# Patient Record
Sex: Male | Born: 2005 | Race: Black or African American | Hispanic: No | Marital: Single | Smoking: Never smoker
Health system: Southern US, Community
[De-identification: ages and names within clinical notes are randomized; demographics above are authoritative.]

## PROBLEM LIST (undated history)

## (undated) DIAGNOSIS — R479 Unspecified speech disturbances: Secondary | ICD-10-CM

## (undated) DIAGNOSIS — T7840XA Allergy, unspecified, initial encounter: Secondary | ICD-10-CM

## (undated) DIAGNOSIS — L309 Dermatitis, unspecified: Secondary | ICD-10-CM

## (undated) DIAGNOSIS — E119 Type 2 diabetes mellitus without complications: Secondary | ICD-10-CM

## (undated) DIAGNOSIS — F88 Other disorders of psychological development: Secondary | ICD-10-CM

## (undated) DIAGNOSIS — H9193 Unspecified hearing loss, bilateral: Secondary | ICD-10-CM

## (undated) DIAGNOSIS — R7303 Prediabetes: Secondary | ICD-10-CM

## (undated) DIAGNOSIS — J45909 Unspecified asthma, uncomplicated: Secondary | ICD-10-CM

## (undated) DIAGNOSIS — IMO0002 Reserved for concepts with insufficient information to code with codable children: Secondary | ICD-10-CM

## (undated) HISTORY — PX: GASTROSTOMY TUBE PLACEMENT: SHX655

## (undated) HISTORY — PX: CIRCUMCISION: SUR203

## (undated) HISTORY — DX: Type 2 diabetes mellitus without complications: E11.9

## (undated) HISTORY — DX: Other disorders of psychological development: F88

## (undated) HISTORY — DX: Reserved for concepts with insufficient information to code with codable children: IMO0002

## (undated) HISTORY — DX: Unspecified hearing loss, bilateral: H91.93

## (undated) HISTORY — PX: TONSILECTOMY, ADENOIDECTOMY, BILATERAL MYRINGOTOMY AND TUBES: SHX2538

## (undated) HISTORY — DX: Prediabetes: R73.03

---
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
## 2023-01-13 ENCOUNTER — Telehealth (INDEPENDENT_AMBULATORY_CARE_PROVIDER_SITE_OTHER): Payer: Self-pay

## 2023-01-13 NOTE — Telephone Encounter (Signed)
Received fax from pharmacy/covermymeds to complete prior authorization initiated on covermymeds, completed prior authorization   Pharmacy would like notification of determination [facility]  P: [phone] F: [id number]

## 2023-01-19 NOTE — Telephone Encounter (Signed)
Received fax requesting notes from the last 3 months and pt using dexcom as prescribed, unable to provide notes in the last 3 months pt has not been seen since 09/17/2022.  Called Patient to update. Mom verbalized understanding and confirmed she has equipment to do finger sticks until follow up appointment with Dr. Quincy Sheehan.

## 2023-02-04 NOTE — Progress Notes (Deleted)
Pediatric Endocrinology Diabetes Consultation Follow-up Visit Joshua Steele 05/26/2005 [id number] Georgann Housekeeper, MD  HPI: Joshua Steele  is a 17 y.o. male presenting for follow-up of Type 2 Diabetes. he is accompanied to this visit by his {family members:20773}.{Interpreter present throughout the visit:29436::"No"}.  Since last visit on 09/17/2022, he has been well.  There have been no ER visits or hospitalizations. At the last visit Libre 3 sample provided and Ozempic changed to [facility]. Annual labs ordered at the last visit were not done. Unable to complete PA for CGM due to no visit in past 3 months.   Insulin regimen: ***units/kg/day {Basal Insulin:29550} *** units at *** {Bolus Insulin:29545}: {Insulin Increments:29547} Time Carb Ratio ISF/CF Target (mg/dL)  Breakfast {Carb WUJWJ:19147} {ISF/CF:29543} {Daytime Target:29542}  Lunch {Carb Ratio:29544} {ISF/CF:29543} {Daytime Target:29542}  Snack {Carb Ratio:29544} {ISF/CF:29543} {Daytime Target:29542}  Dinner {Carb Ratio:29544} {ISF/CF:29543} {Daytime Target:29542}  Bedtime {Carb Ratio:29544} {ISF/CF:29543} {Night Target:29541::"200 mg/dL"}  Other diabetes medication(s): {Yes/No:29440} Hypoglycemia: {can/cannot:17900} feel most low blood sugars.  No glucagon needed recently.  Blood glucose download: {Glucose Meter:29156:::1} CGM download: {Continuous Glucose Monitor:29157}  Med-alert ID: {ACTION; IS/IS WGN:[id number]} currently wearing. Injection/Pump sites: {body part:18749} Health maintenance:  Diabetes Health Maintenance Due  Topic Date Due   FOOT EXAM  Never done   OPHTHALMOLOGY EXAM  11/21/2020   HEMOGLOBIN A1C  03/19/2023    ROS: Greater than 10 systems reviewed with pertinent positives listed in HPI, otherwise neg. The following portions of the patient's history were reviewed and updated as appropriate:  Past Medical History:  has a past medical history of Allergy, Asthma, Asthma, Chronic use of steroids, Diabetes (HCC),  Diabetes mellitus without complication (HCC), Eczema, Global developmental delay, Hearing loss of both ears, Prediabetes, Premature baby, and Speech abnormality.  Medications:  Outpatient Encounter Medications as of 02/05/2023  Medication Sig Note   Accu-Chek FastClix Lancets MISC CHECK BLOOD SUGAR 6 TIMES A DAY. (Patient not taking: Reported on 06/17/2020)    albuterol (PROVENTIL) (2.5 MG/3ML) 0.083% nebulizer solution Take 3 mLs (2.5 mg total) by nebulization every 6 (six) hours as needed for wheezing or shortness of breath. 12/27/2018: PRN   Blood Glucose Monitoring Suppl (ACCU-CHEK GUIDE) w/Device KIT 2 kits by Does not apply route daily as needed. Use to check glucose. (Patient not taking: Reported on 06/17/2020)    Continuous Blood Gluc Receiver (DEXCOM G6 RECEIVER) DEVI 1 Device by Does not apply route as directed. (Patient not taking: Reported on 10/30/2021)    Continuous Blood Gluc Transmit (DEXCOM G6 TRANSMITTER) MISC Inject 1 Device into the skin as directed. (re-use up to 8x with each new sensor) (Patient not taking: Reported on 09/17/2022)    Continuous Glucose Sensor (DEXCOM G6 SENSOR) MISC change sensor every 10 days    DiphenhydrAMINE HCl (BENADRYL ALLERGY PO) Take 1 tablet by mouth daily as needed (allergies). Reported on 06/13/2015    ELDERBERRY PO Take by mouth.    EPINEPHrine (ADRENALIN) 1 MG/ML injection Inject 1 mg into the muscle once as needed for anaphylaxis. Reported on 04/18/2015 10/19/2019: PRN   fluticasone (FLOVENT HFA) 110 MCG/ACT inhaler Inhale 2 puffs into the lungs 2 (two) times daily as needed (SOB).    glucose blood (ACCU-CHEK GUIDE) test strip USE TO check blood glucose SIX times daily (Patient not taking: Reported on 09/17/2022)    hydrocortisone 2.5 % cream Apply topically 2 (two) times daily.    insulin glargine (LANTUS SOLOSTAR) 100 UNIT/ML Solostar Pen Inject up to 50 units daily per provider instructions (Patient not taking: Reported on  05/19/2021)    loratadine  (CLARITIN) 10 MG tablet Take 10 mg by mouth daily as needed for allergies.     metFORMIN (GLUCOPHAGE) 500 MG tablet TAKE TWO TABLETS BY MOUTH TWICE DAILY    mometasone (NASONEX) 50 MCG/ACT nasal spray Place 2 sprays into the nose daily as needed (congestion). Reported on 06/13/2015    MOUNJARO 2.5 MG/0.5ML Pen Inject 2.5 mg into the skin once a week.    polymixin-bacitracin (POLYSPORIN) 500-10000 UNIT/GM OINT ointment Apply 1 application topically 2 (two) times daily. Apply to sore on the ankle until it is improved. 03/15/2018: PRN   TRUEPLUS 5-BEVEL PEN NEEDLES 31G X 5 MM MISC USE TO INJECT SIX timesd PER DAY    No facility-administered encounter medications on file as of 02/05/2023.   Allergies: Allergies  Allergen Reactions   Other Anaphylaxis and Other (See Comments)    "tree nuts"   Peanut-Containing Drug Products Itching, Rash, Shortness Of Breath and Swelling   Peanuts [Peanut Oil] Anaphylaxis and Swelling   Surgical History:  Past Surgical History:  Procedure Laterality Date   CIRCUMCISION     GASTROSTOMY TUBE PLACEMENT     TONSILECTOMY, ADENOIDECTOMY, BILATERAL MYRINGOTOMY AND TUBES     Family History: family history includes Asthma in his brother, maternal grandmother, and mother; Early death in his brother; Hypertension in his father, maternal grandfather, and mother; Obesity in his father and mother; Premature birth in his brother.  Social History: Social History   Social History Narrative          Pt lives at home with mother, sister, and maternal grandparents. Family has birds. 12h grader     Physical Exam:  There were no vitals filed for this visit. There were no vitals taken for this visit. Body mass index: body mass index is unknown because there is no height or weight on file. Blood pressure %iles are not available for patients who are 18 years or older. No height and weight on file for this encounter.   Ht Readings from Last 3 Encounters:  09/17/22 5' 7.56"  (1.716 m) (24%, Z= -0.71)*  05/19/21 5' 8.11" (1.73 m) (33%, Z= -0.44)*  02/12/21 5' 7.32" (1.71 m) (25%, Z= -0.69)*   * Growth percentiles are based on CDC (Boys, 2-20 Years) data.   Wt Readings from Last 3 Encounters:  09/17/22 227 lb (103 kg) (98%, Z= 2.03)*  10/30/21 220 lb 6.4 oz (100 kg) (97%, Z= 1.96)*  05/19/21 (!) 226 lb 6.4 oz (102.7 kg) (98%, Z= 2.11)*   * Growth percentiles are based on CDC (Boys, 2-20 Years) data.    Physical Exam   Labs: Lab Results  Component Value Date   ISLETAB Negative 03/25/2015  , No results found for: "INSULINAB",  Lab Results  Component Value Date   GLUTAMICACAB <5 05/01/2020  , No results found for: "ZNT8AB" No results found for: "LABIA2"  Lab Results  Component Value Date   CPEPTIDE 4.43 (H) 05/01/2020   Last hemoglobin A1c:  Lab Results  Component Value Date   HGBA1C 9.4 09/17/2022   Results for orders placed or performed in visit on 09/17/22  POCT Glucose (Device for Home Use)  Result Value Ref Range   Glucose Fasting, POC     POC Glucose 224 (A) 70 - 99 mg/dl  POCT glycosylated hemoglobin (Hb A1C)  Result Value Ref Range   Hemoglobin A1C     HbA1c POC (<> result, manual entry) 9.4 4.0 - 5.6 %   HbA1c, POC (  prediabetic range)     HbA1c, POC (controlled diabetic range)     Lab Results  Component Value Date   HGBA1C 9.4 09/17/2022   HGBA1C 9.2 (A) 10/30/2021   HGBA1C 8.2 05/19/2021   Lab Results  Component Value Date   MICROALBUR 0.4 08/13/2016   LDLCALC 75 05/19/2021   CREATININE 1.01 05/19/2021   Lab Results  Component Value Date   TSH 1.65 05/19/2021   FREE T4 1.0 05/19/2021    Assessment/Plan: Uncontrolled type 2 diabetes mellitus with hyperglycemia (HCC)    There are no Patient Instructions on file for this visit.  Follow-up:   No follow-ups on file.   Medical decision-making:  I have personally spent *** minutes involved in face-to-face and non-face-to-face activities for this patient on the day of  the visit. Professional time spent includes the following activities, in addition to those noted in the documentation: preparation time/chart review, ordering of medications/tests/procedures, obtaining and/or reviewing separately obtained history, counseling and educating the patient/family/caregiver, performing a medically appropriate examination and/or evaluation, referring and communicating with other health care professionals for care coordination, *** review and interpretation of glucose logs/continuous glucose monitor logs, *** interpretation of pump downloads, ***creating/updating school orders, and documentation in the EHR.  Thank you for the opportunity to participate in the care of our mutual patient. Please do not hesitate to contact me should you have any questions regarding the assessment or treatment plan.   Sincerely,   Silvana Newness, MD

## 2023-02-05 ENCOUNTER — Ambulatory Visit (INDEPENDENT_AMBULATORY_CARE_PROVIDER_SITE_OTHER): Payer: Self-pay | Admitting: Pediatrics

## 2023-02-05 DIAGNOSIS — E1165 Type 2 diabetes mellitus with hyperglycemia: Secondary | ICD-10-CM

## 2023-03-11 ENCOUNTER — Other Ambulatory Visit (INDEPENDENT_AMBULATORY_CARE_PROVIDER_SITE_OTHER): Payer: Self-pay | Admitting: Pediatric Endocrinology

## 2023-03-11 ENCOUNTER — Other Ambulatory Visit (INDEPENDENT_AMBULATORY_CARE_PROVIDER_SITE_OTHER): Payer: Self-pay | Admitting: Pediatrics

## 2023-03-11 DIAGNOSIS — R7303 Prediabetes: Secondary | ICD-10-CM

## 2023-03-11 DIAGNOSIS — E1165 Type 2 diabetes mellitus with hyperglycemia: Secondary | ICD-10-CM

## 2023-03-12 MED ORDER — METFORMIN HCL 500 MG PO TABS: ORAL_TABLET | ORAL | 3 refills | Status: DC

## 2023-03-12 NOTE — Addendum Note (Signed)
Addended by: Morene Antu on: 03/12/2023 11:00 AM   Modules accepted: Orders

## 2023-04-05 ENCOUNTER — Other Ambulatory Visit (INDEPENDENT_AMBULATORY_CARE_PROVIDER_SITE_OTHER): Payer: Self-pay | Admitting: Pediatric Endocrinology

## 2023-05-19 DIAGNOSIS — Z7187 Encounter for pediatric-to-adult transition counseling: Secondary | ICD-10-CM | POA: Insufficient documentation

## 2023-05-19 NOTE — Progress Notes (Unsigned)
Pediatric Endocrinology Diabetes Consultation Follow-up Visit Joshua Steele 10/31/2005 [id number] Georgann Housekeeper, MD  HPI: Joshua Steele  is a 18 y.o. male presenting for follow-up of {DIABETES TYPE PLUS:20287}. he is accompanied to this visit by his {family members:20773}.{Interpreter present throughout the visit:29436::"No"}.  Since last visit on 09/17/2022, he has been well.  There have been no ER visits or hospitalizations.  Insulin regimen: ***units/kg/day {Basal Insulin:29550} *** units at *** {Bolus Insulin:29545}: {Insulin Increments:29547} Time Carb Ratio ISF/CF Target (mg/dL)  Breakfast {Carb WUJWJ:19147} {ISF/CF:29543} {Daytime Target:29542}  Lunch {Carb Ratio:29544} {ISF/CF:29543} {Daytime Target:29542}  Snack {Carb Ratio:29544} {ISF/CF:29543} {Daytime Target:29542}  Dinner {Carb Ratio:29544} {ISF/CF:29543} {Daytime Target:29542}  Bedtime {Carb Ratio:29544} {ISF/CF:29543} {Night Target:29541::"200 mg/dL"}  Other diabetes medication(s): {Yes/No:29440} Hypoglycemia: {can/cannot:17900} feel most low blood sugars.  No glucagon needed recently.  CGM download: {Continuous Glucose Monitor:29157}  Med-alert ID: {ACTION; IS/IS WGN:[id number]} currently wearing. Injection/Pump sites: {body part:18749} Health maintenance:  Diabetes Health Maintenance Due  Topic Date Due   FOOT EXAM  Never done   OPHTHALMOLOGY EXAM  11/21/2020   HEMOGLOBIN A1C  03/19/2023    ROS: Greater than 10 systems reviewed with pertinent positives listed in HPI, otherwise neg. The following portions of the patient's history were reviewed and updated as appropriate:  Past Medical History:  has a past medical history of Allergy, Asthma, Asthma, Chronic use of steroids, Diabetes (HCC), Diabetes mellitus without complication (HCC), Eczema, Global developmental delay, Hearing loss of both ears, Prediabetes, Premature baby, and Speech abnormality.  Medications:  Outpatient Encounter Medications as of 05/20/2023   Medication Sig Note   Accu-Chek FastClix Lancets MISC CHECK BLOOD SUGAR 6 TIMES A DAY. (Patient not taking: Reported on 06/17/2020)    albuterol (PROVENTIL) (2.5 MG/3ML) 0.083% nebulizer solution Take 3 mLs (2.5 mg total) by nebulization every 6 (six) hours as needed for wheezing or shortness of breath. 12/27/2018: PRN   Blood Glucose Monitoring Suppl (ACCU-CHEK GUIDE) w/Device KIT 2 kits by Does not apply route daily as needed. Use to check glucose. (Patient not taking: Reported on 06/17/2020)    Continuous Blood Gluc Receiver (DEXCOM G6 RECEIVER) DEVI 1 Device by Does not apply route as directed. (Patient not taking: Reported on 10/30/2021)    Continuous Blood Gluc Transmit (DEXCOM G6 TRANSMITTER) MISC Inject 1 Device into the skin as directed. (re-use up to 8x with each new sensor) (Patient not taking: Reported on 09/17/2022)    Continuous Glucose Sensor (DEXCOM G6 SENSOR) MISC change sensor every 10 days    DiphenhydrAMINE HCl (BENADRYL ALLERGY PO) Take 1 tablet by mouth daily as needed (allergies). Reported on 06/13/2015    ELDERBERRY PO Take by mouth.    EPINEPHrine (ADRENALIN) 1 MG/ML injection Inject 1 mg into the muscle once as needed for anaphylaxis. Reported on 04/18/2015 10/19/2019: PRN   fluticasone (FLOVENT HFA) 110 MCG/ACT inhaler Inhale 2 puffs into the lungs 2 (two) times daily as needed (SOB).    glucose blood (ACCU-CHEK GUIDE) test strip USE TO check blood glucose SIX times daily (Patient not taking: Reported on 09/17/2022)    hydrocortisone 2.5 % cream Apply topically 2 (two) times daily.    insulin glargine (LANTUS SOLOSTAR) 100 UNIT/ML Solostar Pen Inject up to 50 units daily per provider instructions (Patient not taking: Reported on 05/19/2021)    loratadine (CLARITIN) 10 MG tablet Take 10 mg by mouth daily as needed for allergies.     metFORMIN (GLUCOPHAGE) 500 MG tablet TAKE TWO TABLETS BY MOUTH TWICE DAILY.  Needs to keep appt for further refills.  mometasone (NASONEX) 50 MCG/ACT  nasal spray Place 2 sprays into the nose daily as needed (congestion). Reported on 06/13/2015    polymixin-bacitracin (POLYSPORIN) 500-10000 UNIT/GM OINT ointment Apply 1 application topically 2 (two) times daily. Apply to sore on the ankle until it is improved. 03/15/2018: PRN   tirzepatide (MOUNJARO) 2.5 MG/0.5ML Pen Inject 2.5 mg into the skin once a week.    TRUEPLUS 5-BEVEL PEN NEEDLES 31G X 5 MM MISC USE TO INJECT SIX timesd PER DAY    No facility-administered encounter medications on file as of 05/20/2023.   Allergies: Allergies  Allergen Reactions   Other Anaphylaxis and Other (See Comments)    "tree nuts"   Peanut-Containing Drug Products Itching, Rash, Shortness Of Breath and Swelling   Peanuts [Peanut Oil] Anaphylaxis and Swelling   Surgical History:  Past Surgical History:  Procedure Laterality Date   CIRCUMCISION     GASTROSTOMY TUBE PLACEMENT     TONSILECTOMY, ADENOIDECTOMY, BILATERAL MYRINGOTOMY AND TUBES     Family History: family history includes Asthma in his brother, maternal grandmother, and mother; Early death in his brother; Hypertension in his father, maternal grandfather, and mother; Obesity in his father and mother; Premature birth in his brother.  Social History: Social History   Social History Narrative          Pt lives at home with mother, sister, and maternal grandparents. Family has birds. 12h grader     Physical Exam:  There were no vitals filed for this visit. There were no vitals taken for this visit. Body mass index: body mass index is unknown because there is no height or weight on file. Blood pressure %iles are not available for patients who are 18 years or older. No height and weight on file for this encounter.   Ht Readings from Last 3 Encounters:  09/17/22 5' 7.56" (1.716 m) (24%, Z= -0.71)*  05/19/21 5' 8.11" (1.73 m) (33%, Z= -0.44)*  02/12/21 5' 7.32" (1.71 m) (25%, Z= -0.69)*   * Growth percentiles are based on CDC (Boys, 2-20  Years) data.   Wt Readings from Last 3 Encounters:  09/17/22 227 lb (103 kg) (98%, Z= 2.03)*  10/30/21 220 lb 6.4 oz (100 kg) (97%, Z= 1.96)*  05/19/21 (!) 226 lb 6.4 oz (102.7 kg) (98%, Z= 2.11)*   * Growth percentiles are based on CDC (Boys, 2-20 Years) data.    Physical Exam   Labs: Lab Results  Component Value Date   ISLETAB Negative 03/25/2015  , No results found for: "INSULINAB",  Lab Results  Component Value Date   GLUTAMICACAB <5 05/01/2020  , No results found for: "ZNT8AB" No results found for: "LABIA2"  Lab Results  Component Value Date   CPEPTIDE 4.43 (H) 05/01/2020   Last hemoglobin A1c:  Lab Results  Component Value Date   HGBA1C 9.4 09/17/2022   Results for orders placed or performed in visit on 09/17/22  POCT Glucose (Device for Home Use)   Collection Time: 09/17/22  9:18 AM  Result Value Ref Range   Glucose Fasting, POC     POC Glucose 224 (A) 70 - 99 mg/dl  POCT glycosylated hemoglobin (Hb A1C)   Collection Time: 09/17/22  9:27 AM  Result Value Ref Range   Hemoglobin A1C     HbA1c POC (<> result, manual entry) 9.4 4.0 - 5.6 %   HbA1c, POC (prediabetic range)     HbA1c, POC (controlled diabetic range)     Lab Results  Component Value Date  HGBA1C 9.4 09/17/2022   HGBA1C 9.2 (A) 10/30/2021   HGBA1C 8.2 05/19/2021   Lab Results  Component Value Date   MICROALBUR 0.4 08/13/2016   LDLCALC 75 05/19/2021   CREATININE 1.01 05/19/2021   Lab Results  Component Value Date   TSH 1.65 05/19/2021   FREE T4 1.0 05/19/2021    Assessment/Plan: Uncontrolled type 2 diabetes mellitus with hyperglycemia (HCC) Overview: Almond was admitted to [facility] in February 2012 due to respiratory distress. During that admission he was noted to have hyperglycemia. His A1C was 6.4%. [name] was consulted and started [name] 250 mg BID. He had been taking it all spring. He stopped taking it over the summer but restarted in September 2012 after a visit to Dr.  [name]. He was lost to follow up until he presented to Dr. [name] in December 2016 with a random glucose of 417. He was admitted and ultimately treated with metformin and glipizide. Mom found it very hard to try and learn to manage insulin.      There are no Patient Instructions on file for this visit.  Follow-up:   No follow-ups on file.   Medical decision-making:  I have personally spent *** minutes involved in face-to-face and non-face-to-face activities for this patient on the day of the visit. Professional time spent includes the following activities, in addition to those noted in the documentation: preparation time/chart review, ordering of medications/tests/procedures, obtaining and/or reviewing separately obtained history, counseling and educating the patient/family/caregiver, performing a medically appropriate examination and/or evaluation, referring and communicating with other health care professionals for care coordination, *** review and interpretation of glucose logs/continuous glucose monitor logs, *** interpretation of pump downloads, ***creating/updating school orders, and documentation in the EHR. This time does not include the time spent for CGM interpretation.   Thank you for the opportunity to participate in the care of our mutual patient. Please do not hesitate to contact me should you have any questions regarding the assessment or treatment plan.   Sincerely,   Silvana Newness, MD

## 2023-05-20 ENCOUNTER — Ambulatory Visit (INDEPENDENT_AMBULATORY_CARE_PROVIDER_SITE_OTHER): Payer: Self-pay | Admitting: Pediatrics

## 2023-05-20 ENCOUNTER — Encounter (INDEPENDENT_AMBULATORY_CARE_PROVIDER_SITE_OTHER): Payer: Self-pay | Admitting: Pediatrics

## 2023-05-20 VITALS — BP 118/80 | HR 84 | Wt 228.4 lb

## 2023-05-20 DIAGNOSIS — E1165 Type 2 diabetes mellitus with hyperglycemia: Secondary | ICD-10-CM

## 2023-05-20 DIAGNOSIS — E66812 Obesity, class 2: Secondary | ICD-10-CM

## 2023-05-20 DIAGNOSIS — Z7187 Encounter for pediatric-to-adult transition counseling: Secondary | ICD-10-CM

## 2023-05-20 DIAGNOSIS — Z6835 Body mass index (BMI) 35.0-35.9, adult: Secondary | ICD-10-CM | POA: Diagnosis not present

## 2023-05-20 DIAGNOSIS — Z91199 Patient's noncompliance with other medical treatment and regimen due to unspecified reason: Secondary | ICD-10-CM

## 2023-05-20 DIAGNOSIS — F840 Autistic disorder: Secondary | ICD-10-CM

## 2023-05-20 DIAGNOSIS — Z7984 Long term (current) use of oral hypoglycemic drugs: Secondary | ICD-10-CM

## 2023-05-20 LAB — POCT GLYCOSYLATED HEMOGLOBIN (HGB A1C): Hemoglobin A1C: 9.3 % — AB (ref 4.0–5.6)

## 2023-05-20 LAB — POCT GLUCOSE (DEVICE FOR HOME USE): POC Glucose: 288 mg/dL — AB (ref 70–99)

## 2023-05-20 MED ORDER — DAPAGLIFLOZIN PROPANEDIOL 5 MG PO TABS: 5.0000 mg | ORAL_TABLET | Freq: Every day | ORAL | 1 refills | Status: AC

## 2023-05-20 MED ORDER — ACCU-CHEK GUIDE W/DEVICE KIT: PACK | 1 refills | Status: AC

## 2023-05-20 MED ORDER — ACCU-CHEK GUIDE TEST VI STRP: ORAL_STRIP | 5 refills | Status: AC

## 2023-05-20 MED ORDER — METFORMIN HCL ER 500 MG PO TB24: 1000.0000 mg | ORAL_TABLET | Freq: Two times a day (BID) | ORAL | 1 refills | Status: AC

## 2023-05-20 MED ORDER — ACCU-CHEK SOFTCLIX LANCETS MISC: 5 refills | Status: AC

## 2023-05-20 NOTE — Assessment & Plan Note (Signed)
Diabetes mellitus Type II, under poor control. The HbA1c is above goal of 7% or lower. A1c has been relatively unchanged over a year. Honoring that he is only willing to take pills, will continue metformin and start SGLT-2. Risks and benefits reviewed. Discussed symptoms due to hyperglycemia and usually would start insulin with this elevated A1c. He is following up with adult endo next month, so provided refills until then and defer to their management. He is overdue for annual screening and defer to adult management as well since we have ordered, and it has not been done.  Medications: continued metformin; See medication orders below, discontinued GLP-1; See medication orders below, and started SGLT2; See medication orders below, School Orders/DMMP: Not needed, Laboratory Studies: POCT HbA1c at next visit, Education: Discussed foot care, Dietary counseling provided focusing on ADA diet, meeting cholesterol goals, and healthy relationship with food, Encouraged aerobic exercise, Discussed diabetes mellitus pathophysiology and management, and Discussed that adult endo will need to order annual screening studies, Provided Printed Education Material/has MyChart Access, Vaccine Counseling Provided for ADA Recommended Vaccinations, and Due for optho soon

## 2023-05-20 NOTE — Patient Instructions (Addendum)
HbA1c Goals: Our ultimate goal is to achieve the lowest possible HbA1c while avoiding recurrent severe hypoglycemia.  However, all HbA1c goals must be individualized per the American Diabetes Association Clinical Standards. My Hemoglobin A1c History:  Lab Results  Component Value Date   HGBA1C 9.3 (A) 05/20/2023   HGBA1C 9.4 09/17/2022   HGBA1C 9.2 (A) 10/30/2021   HGBA1C 8.2 05/19/2021   HGBA1C 11.4 (A) 02/12/2021   HGBA1C 8.4 (A) 11/06/2020   HGBA1C 8.2 (A) 07/30/2020   HGBA1C 11.5 (A) 05/01/2020   HGBA1C 7.1 (A) 01/24/2020   HGBA1C 9.8 (H) 04/19/2019   HGBA1C 16.3 (H) 03/25/2015   HGBA1C (H) 05/20/2010    6.4 (NOTE)                                                                       According to the ADA Clinical Practice Recommendations for 2011, when HbA1c is used as a screening test:   >=6.5%   Diagnostic of Diabetes Mellitus           (if abnormal result  is confirmed)  5.7-6.4%   Increased risk of developing Diabetes Mellitus  References:Diagnosis and Classification of Diabetes Mellitus,Diabetes Care,2011,34(Suppl 1):S62-S69 and Standards of Medical Care in         Diabetes - 2011,Diabetes Care,2011,34  (Suppl 1):S11-S61.   My goal HbA1c is: < 7 %  This is equivalent to an average blood glucose of:  HbA1c % = Average BG  5  97 (78-120)__ 6  126 (100-152)  7  154 (123-185) 8  183 (147-217)  9  212 (170-249)  10  240 (193-282)  11  269 (217-314)  12  298 (240-347)  13  330    Time in Range (TIR) Goals: Target Range over 70% of the time and Very Low less than 4% of the time.  Farxiga Sick Day Plan You are at risk of developing ketones while your blood sugars are at normal range 70-180 mg/dL on this medication, especially when sick. The plan when sick is to: - STOP [place] - Drink water frequently (1 cup of water every hour while awake) - PLEASE contact provider at [phone] for an insulin adjustment    Adult endo follow up 06/28/23 at 1PM  Medications:  Please  allow 3 days for prescription refill requests! After hours are for emergencies only.  Check Blood Glucose: Remember to bring to next endo appt do download. They can use this to get CGM PA processed. Before breakfast, before lunch, before dinner, at bedtime, and for symptoms of high or low blood glucose as a minimum.  Check BG 2 hours after meals if adjusting doses.   Check more frequently on days with more activity than normal.   Check in the middle of the night when evening insulin doses are changed, on days with extra activity in the evening, and if you suspect overnight low glucoses are occurring.   Send a MyChart message as needed for patterns of high or low glucose levels, or multiple low glucoses. As a general rule, ALWAYS call us to review your child's blood glucoses IF: Your child has a seizure You have to use glucagon/Baqsimi/Gvoke or glucose gel to bring up the blood sugar  IF you notice  a pattern of high blood sugars  If in a week, your child has: 1 blood glucose that is 40 or less  2 blood glucoses that are 50 or less at the same time of day 3 blood glucoses that are 60 or less at the same time of day  Phone: [phone] Ketones: Check urine or blood ketones, and if blood glucose is greater than 300 mg/dL (injections) or 098 mg/dL (pump), when ill, or if having symptoms of ketones.  Call if Urine Ketones are moderate or large Call if Blood Ketones are moderate (1-1.5) or large (more than1.5) Exercise Plan:  Any activity that makes you sweat most days for 60 minutes.  Safety Wear Medical Alert at [facility] Citizens requesting the Yellow Dot Packages should contact Airline pilot at the [facility] by calling [phone] or e-mail [email or web address]. TEEN REMINDERS:  Check blood glucose before driving If sexually active, use reliable birth control including condoms.  Alcohol in moderation only - check glucoses more frequently, & have a  snack with no carb coverage. Glucose gel/cake icing for low glucose. Check glucoses in the middle of the night. Education:Please refer to your diabetes education book. A copy can be found here: [email or web address] Other: Schedule an eye exam yearly and a dental exam.  Recommend dental cleaning every 6 months. Get a flu vaccine yearly, and Covid-19 vaccine yearly unless contraindicated. Rotate injections sites and avoid any hard lumps (lipohypertrophy)

## 2023-05-28 ENCOUNTER — Other Ambulatory Visit (INDEPENDENT_AMBULATORY_CARE_PROVIDER_SITE_OTHER): Payer: Self-pay | Admitting: Pediatrics

## 2023-06-28 ENCOUNTER — Ambulatory Visit: Payer: Medicaid Other | Admitting: Nurse Practitioner

## 2023-06-28 DIAGNOSIS — E1165 Type 2 diabetes mellitus with hyperglycemia: Secondary | ICD-10-CM

## 2023-06-28 DIAGNOSIS — Z7984 Long term (current) use of oral hypoglycemic drugs: Secondary | ICD-10-CM

## 2023-06-28 DIAGNOSIS — Z91199 Patient's noncompliance with other medical treatment and regimen due to unspecified reason: Secondary | ICD-10-CM

## 2023-06-28 NOTE — Progress Notes (Deleted)
 Endocrinology Consult Note       06/28/2023, 7:34 AM   Subjective:    Patient ID: Joshua Steele, male    DOB: Jan 09, 2006.  Joshua Steele is being seen in consultation for management of currently uncontrolled symptomatic diabetes requested by  Georgann Housekeeper, MD.  He was previously followed by Peds Endo.   Past Medical History:  Diagnosis Date   Allergy    P-nuts & tree nuts   Asthma    Asthma    Chronic use of steroids    Diabetes (HCC)    Diabetes mellitus without complication (HCC)    Phreesia 01/31/2020   Eczema    Global developmental delay    Hearing loss of both ears    Prediabetes    Premature baby    ex- 24 weeker   Speech abnormality     Past Surgical History:  Procedure Laterality Date   CIRCUMCISION     GASTROSTOMY TUBE PLACEMENT     TONSILECTOMY, ADENOIDECTOMY, BILATERAL MYRINGOTOMY AND TUBES      Social History   Socioeconomic History   Marital status: Single    Spouse name: Not on file   Number of children: Not on file   Years of education: Not on file   Highest education level: Not on file  Occupational History   Not on file  Tobacco Use   Smoking status: Never    Passive exposure: Yes   Smokeless tobacco: Never   Tobacco comments:    smokes outside  Substance and Sexual Activity   Alcohol use: No   Drug use: No   Sexual activity: Never  Other Topics Concern   Not on file  Social History Narrative       Pt lives at home with mother, sister, and maternal grandparents. Family has birds. 12h grader   Social Drivers of Corporate investment banker Strain: Not on file  Food Insecurity: Not on file  Transportation Needs: Not on file  Physical Activity: Not on file  Stress: Not on file  Social Connections: Not on file    Family History  Problem Relation Age of Onset   Hypertension Mother    Obesity Mother    Asthma Mother    Obesity Father     Hypertension Father    Hypertension Maternal Grandfather    Asthma Brother    Early death Brother        asphyxiation   Premature birth Brother    Asthma Maternal Grandmother    Diabetes Neg Hx    Thyroid disease Neg Hx     Outpatient Encounter Medications as of 06/28/2023  Medication Sig   Accu-Chek Softclix Lancets lancets Use as directed to check glucose 6x/day.   albuterol (PROVENTIL) (2.5 MG/3ML) 0.083% nebulizer solution Take 3 mLs (2.5 mg total) by nebulization every 6 (six) hours as needed for wheezing or shortness of breath. (Patient not taking: Reported on 05/20/2023)   Blood Glucose Monitoring Suppl (ACCU-CHEK GUIDE) w/Device KIT Use as directed to check glucose.   Continuous Blood Gluc Receiver (DEXCOM G6 RECEIVER) DEVI 1 Device by Does not apply route as directed. (Patient not taking: Reported on 05/20/2023)   Continuous Blood Gluc  Transmit (DEXCOM G6 TRANSMITTER) MISC Inject 1 Device into the skin as directed. (re-use up to 8x with each new sensor) (Patient not taking: Reported on 05/20/2023)   Continuous Glucose Sensor (DEXCOM G6 SENSOR) MISC change sensor every 10 days (Patient not taking: Reported on 05/20/2023)   dapagliflozin propanediol (FARXIGA) 5 MG TABS tablet Take 1 tablet (5 mg total) by mouth daily.   DiphenhydrAMINE HCl (BENADRYL ALLERGY PO) Take 1 tablet by mouth daily as needed (allergies). Reported on 06/13/2015 (Patient not taking: Reported on 05/20/2023)   ELDERBERRY PO Take by mouth. (Patient not taking: Reported on 05/20/2023)   EPINEPHrine (ADRENALIN) 1 MG/ML injection Inject 1 mg into the muscle once as needed for anaphylaxis. Reported on 04/18/2015 (Patient not taking: Reported on 05/20/2023)   fluticasone (FLOVENT HFA) 110 MCG/ACT inhaler Inhale 2 puffs into the lungs 2 (two) times daily as needed (SOB). (Patient not taking: Reported on 05/20/2023)   glucose blood (ACCU-CHEK GUIDE TEST) test strip Use as instructed 6x/day   hydrocortisone 2.5 % cream Apply topically  2 (two) times daily. (Patient not taking: Reported on 05/20/2023)   loratadine (CLARITIN) 10 MG tablet Take 10 mg by mouth daily as needed for allergies.    metFORMIN (GLUCOPHAGE-XR) 500 MG 24 hr tablet Take 2 tablets (1,000 mg total) by mouth 2 (two) times daily with a meal.   mometasone (NASONEX) 50 MCG/ACT nasal spray Place 2 sprays into the nose daily as needed (congestion). Reported on 06/13/2015 (Patient not taking: Reported on 05/20/2023)   polymixin-bacitracin (POLYSPORIN) 500-10000 UNIT/GM OINT ointment Apply 1 application topically 2 (two) times daily. Apply to sore on the ankle until it is improved. (Patient not taking: Reported on 05/20/2023)   TRUEPLUS 5-BEVEL PEN NEEDLES 31G X 5 MM MISC USE TO INJECT SIX timesd PER DAY (Patient not taking: Reported on 05/20/2023)   No facility-administered encounter medications on file as of 06/28/2023.    ALLERGIES: Allergies  Allergen Reactions   Other Anaphylaxis and Other (See Comments)    "tree nuts"   Peanut-Containing Drug Products Itching, Rash, Shortness Of Breath and Swelling   Peanuts [Peanut Oil] Anaphylaxis and Swelling    VACCINATION STATUS: Immunization History  Administered Date(s) Administered   DTaP 08/20/2003, 10/09/2003, 12/07/2003, 09/10/2004   DTaP / IPV 07/06/2007   HIB (PRP-T) 08/20/2003, 10/09/2003, 06/05/2004, 12/06/2004   Hepatitis A, Ped/Adol-2 Dose 01/20/2005, 08/24/2005   Hepatitis B, PED/ADOLESCENT 09/12/2003, 11/19/2003, 12/07/2003, 01/20/2005   IPV 08/20/2003, 10/09/2003, 06/05/2004   Influenza Split 01/24/2004, 03/24/2007, 02/28/2008, 05/15/2009   MMR 06/05/2004, 07/06/2007   Pneumococcal Polysaccharide-23 03/28/2015   Pneumococcal-Unspecified 08/20/2003, 10/09/2003, 12/07/2003, 06/05/2004   Varicella 09/10/2004, 07/06/2007    Diabetes He presents for his initial diabetic visit. He has type 2 diabetes mellitus. Onset time: diagnosed at approx age of 18. His disease course has been fluctuating. There are no  hypoglycemic associated symptoms. There are no hypoglycemic complications. There are no diabetic complications. Risk factors for coronary artery disease include diabetes mellitus, male sex, obesity and sedentary lifestyle. Current diabetic treatment includes oral agent (dual therapy). He is compliant with treatment most of the time. His weight is fluctuating minimally. He is following a generally unhealthy diet. When asked about meal planning, he reported none. He has not had a previous visit with a dietitian. He participates in exercise intermittently. An ACE inhibitor/angiotensin II receptor blocker is not being taken. He does not see a podiatrist.Eye exam is current.     Review of systems  Constitutional: + Minimally fluctuating body weight, current  There is no height or weight on file to calculate BMI., no fatigue, no subjective hyperthermia, no subjective hypothermia Eyes: no blurry vision, no xerophthalmia ENT: no sore throat, no nodules palpated in throat, no dysphagia/odynophagia, no hoarseness Cardiovascular: no chest pain, no shortness of breath, no palpitations, no leg swelling Respiratory: no cough, no shortness of breath Gastrointestinal: no nausea/vomiting/diarrhea Musculoskeletal: no muscle/joint aches Skin: no rashes, no hyperemia Neurological: no tremors, no numbness, no tingling, no dizziness Psychiatric: no depression, no anxiety  Objective:     There were no vitals taken for this visit.  Wt Readings from Last 3 Encounters:  05/20/23 228 lb 6.4 oz (103.6 kg) (98%, Z= 2.00)*  09/17/22 227 lb (103 kg) (98%, Z= 2.03)*  10/30/21 220 lb 6.4 oz (100 kg) (97%, Z= 1.96)*   * Growth percentiles are based on CDC (Boys, 2-20 Years) data.     BP Readings from Last 3 Encounters:  05/20/23 118/80  09/17/22 110/82  10/30/21 108/66     Physical Exam- Limited  Constitutional:  There is no height or weight on file to calculate BMI. , not in acute distress, normal state of  mind Eyes:  EOMI, no exophthalmos Neck: Supple Cardiovascular: RRR, no murmurs, rubs, or gallops, no edema Respiratory: Adequate breathing efforts, no crackles, rales, rhonchi, or wheezing Musculoskeletal: no gross deformities, strength intact in all four extremities, no gross restriction of joint movements Skin:  no rashes, no hyperemia Neurological: no tremor with outstretched hands   Diabetic Foot Exam - Simple   No data filed      CMP ( most recent) CMP     Component Value Date/Time   NA 139 05/19/2021 1105   K 4.4 05/19/2021 1105   CL 100 05/19/2021 1105   CO2 27 05/19/2021 1105   GLUCOSE 109 (H) 05/19/2021 1105   BUN 8 05/19/2021 1105   CREATININE 1.01 05/19/2021 1105   CALCIUM 10.3 05/19/2021 1105   PROT 8.0 05/19/2021 1105   ALBUMIN 4.3 08/13/2016 0857   AST 10 (L) 05/19/2021 1105   ALT 15 05/19/2021 1105   ALKPHOS 81 (L) 08/13/2016 0857   BILITOT 0.6 05/19/2021 1105   GFRNONAA NOT CALCULATED 03/25/2015 1706     Diabetic Labs (most recent): Lab Results  Component Value Date   HGBA1C 9.3 (A) 05/20/2023   HGBA1C 9.4 09/17/2022   HGBA1C 9.2 (A) 10/30/2021   MICROALBUR 0.4 08/13/2016     Lipid Panel ( most recent) Lipid Panel     Component Value Date/Time   CHOL 127 05/19/2021 1105   TRIG 88 05/19/2021 1105   HDL 35 (L) 05/19/2021 1105   CHOLHDL 3.6 05/19/2021 1105   VLDL 19 08/13/2016 0857   LDLCALC 75 05/19/2021 1105      Lab Results  Component Value Date   TSH 1.65 05/19/2021   TSH 1.84 05/01/2020   TSH 1.91 04/19/2019   TSH 1.75 12/07/2017   TSH 1.40 08/13/2016   TSH 2.793 03/25/2015   TSH 0.622 ***Test methodology is 3rd generation TSH*** (L) 10/31/2008   FREET4 1.0 05/19/2021   FREET4 1.3 05/01/2020   FREET4 1.1 12/07/2017   FREET4 1.0 08/13/2016   FREET4 0.96 03/25/2015   FREET4 0.74 (L) 10/31/2008           Assessment & Plan:   1) Type 2 diabetes mellitus with hyperglycemia, without long-term current use of insulin (HCC)  (Primary)    - Virgle Arth has currently uncontrolled symptomatic type 2 DM since 18 years of age,  with most recent A1c of 9.3 %.   -Recent labs reviewed.  - I had a long discussion with him about the progressive nature of diabetes and the pathology behind its complications. -his diabetes is complicated by fluctuating compliance and he remains at a high risk for more acute and chronic complications which include CAD, CVA, CKD, retinopathy, and neuropathy. These are all discussed in detail with him.  The following Lifestyle Medicine recommendations according to American College of Lifestyle Medicine [name]) were discussed and offered to patient and he agrees to start the journey:  A. Whole Foods, Plant-based plate comprising of fruits and vegetables, plant-based proteins, whole-grain carbohydrates was discussed in detail with the patient.   A list for source of those nutrients were also provided to the patient.  Patient will use only water or unsweetened tea for hydration. B.  The need to stay away from risky substances including alcohol, smoking; obtaining 7 to 9 hours of restorative sleep, at least 150 minutes of moderate intensity exercise weekly, the importance of healthy social connections,  and stress reduction techniques were discussed. C.  A full color page of  Calorie density of various food groups per pound showing examples of each food groups was provided to the patient.  - I have counseled him on diet and weight management by adopting a carbohydrate restricted/protein rich diet. Patient is encouraged to switch to unprocessed or minimally processed complex starch and increased protein intake (animal or plant source), fruits, and vegetables. -  he is advised to stick to a routine mealtimes to eat 3 meals a day and avoid unnecessary snacks (to snack only to correct hypoglycemia).   - he acknowledges that there is a room for improvement in his food and drink choices. - Suggestion is  made for him to avoid simple carbohydrates from his diet including Cakes, Sweet Desserts, Ice Cream, Soda (diet and regular), Sweet Tea, Candies, Chips, Cookies, Store Bought Juices, Alcohol in Excess of 1-2 drinks a day, Artificial Sweeteners, Coffee Creamer, and "Sugar-free" Products. This will help patient to have more stable blood glucose profile and potentially avoid unintended weight gain.  - he will be scheduled with Norm Salt, RDN, CDE for diabetes education.  - I have approached him with the following individualized plan to manage his diabetes and patient agrees:    -he is encouraged to start/continue monitoring glucose 2 times daily, before breakfast and before bed, to log their readings on the clinic sheets provided, and bring them to review at follow up appointment in 3 months.  - Adjustment parameters are given to him for hypo and hyperglycemia in writing. - he is encouraged to call clinic for blood glucose levels less than 70 or above 300 mg /dl. - he is advised to continue ***, therapeutically suitable for patient .  - he will be considered for incretin therapy as appropriate next visit.  Was prescribed Mounjaro in the past but refused to take injections.  - Specific targets for  A1c; LDL, HDL, and Triglycerides were discussed with the patient.  2) Blood Pressure /Hypertension:  his blood pressure is controlled to target without the use of antihypertensive medications.  3) Lipids/Hyperlipidemia:    Review of his recent lipid panel from 05/19/21 showed controlled LDL at 75.   He is not currently on any lipid lowering medications.  4)  Weight/Diet:  his There is no height or weight on file to calculate BMI.  -  clearly complicating his diabetes care.   he is a  candidate for weight loss. I discussed with him the fact that loss of 5 - 10% of his  current body weight will have the most impact on his diabetes management.  Exercise, and detailed carbohydrates information provided   -  detailed on discharge instructions.  5) Chronic Care/Health Maintenance: -he is not on ACEI/ARB or Statin medications and is encouraged to initiate and continue to follow up with Ophthalmology, Dentist, Podiatrist at least yearly or according to recommendations, and advised to stay away from smoking. I have recommended yearly flu vaccine and pneumonia vaccine at least every 5 years; moderate intensity exercise for up to 150 minutes weekly; and sleep for at least 7 hours a day.  - he is advised to maintain close follow up with Georgann Housekeeper, MD for primary care needs, as well as his other providers for optimal and coordinated care.   - Time spent in this patient care: 60 min, of which > 50% was spent in counseling him about his diabetes and the rest reviewing his blood glucose logs, discussing his hypoglycemia and hyperglycemia episodes, reviewing his current and previous labs/studies (including abstraction from other facilities) and medications doses and developing a long term treatment plan based on the latest standards of care/guidelines; and documenting his care.    Please refer to Patient Instructions for Blood Glucose Monitoring and Insulin/Medications Dosing Guide" in media tab for additional information. Please also refer to "Patient Self Inventory" in the Media tab for reviewed elements of pertinent patient history.  Jerald Kief participated in the discussions, expressed understanding, and voiced agreement with the above plans.  All questions were answered to his satisfaction. he is encouraged to contact clinic should he have any questions or concerns prior to his return visit.     Follow up plan: - No follow-ups on file.    Ronny Bacon, [facility] [address] [place], [place] [address] Phone: [phone] Fax: [phone]  06/28/2023, 7:34 AM

## 2023-10-11 ENCOUNTER — Ambulatory Visit: Admitting: Nurse Practitioner

## 2023-10-11 ENCOUNTER — Encounter: Payer: Self-pay | Admitting: Nurse Practitioner

## 2023-10-11 ENCOUNTER — Telehealth: Payer: Self-pay | Admitting: Nurse Practitioner

## 2023-10-11 NOTE — Patient Instructions (Incomplete)

## 2023-10-11 NOTE — Telephone Encounter (Signed)
Sent no show letter to pt
